# Patient Record
Sex: Male | Born: 1990 | Race: White | Hispanic: No | Smoking: Never smoker
Health system: Southern US, Community
[De-identification: ages and names within clinical notes are randomized; demographics above are authoritative.]

## PROBLEM LIST (undated history)

## (undated) HISTORY — PX: NOSE SURGERY: SHX723

## (undated) HISTORY — PX: APPENDECTOMY: SHX54

---
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
## 2010-03-04 ENCOUNTER — Observation Stay: Payer: Self-pay | Admitting: Surgery

## 2010-09-14 ENCOUNTER — Emergency Department: Payer: Self-pay | Admitting: Emergency Medicine

## 2010-11-21 ENCOUNTER — Emergency Department (HOSPITAL_COMMUNITY): Payer: BC Managed Care – PPO

## 2010-11-21 ENCOUNTER — Emergency Department (HOSPITAL_COMMUNITY)
Admission: EM | Admit: 2010-11-21 | Discharge: 2010-11-21 | Disposition: A | Discharge status: Home / Self Care | Payer: BC Managed Care – PPO | Attending: Emergency Medicine | Admitting: Emergency Medicine

## 2010-11-21 DIAGNOSIS — Y9229 Other specified public building as the place of occurrence of the external cause: Secondary | ICD-10-CM | POA: Insufficient documentation

## 2010-11-21 DIAGNOSIS — R4182 Altered mental status, unspecified: Secondary | ICD-10-CM | POA: Insufficient documentation

## 2010-11-21 DIAGNOSIS — R55 Syncope and collapse: Secondary | ICD-10-CM | POA: Insufficient documentation

## 2010-11-21 DIAGNOSIS — F1010 Alcohol abuse, uncomplicated: Secondary | ICD-10-CM | POA: Insufficient documentation

## 2010-11-21 DIAGNOSIS — IMO0002 Reserved for concepts with insufficient information to code with codable children: Secondary | ICD-10-CM | POA: Insufficient documentation

## 2010-11-21 DIAGNOSIS — W1789XA Other fall from one level to another, initial encounter: Secondary | ICD-10-CM | POA: Insufficient documentation

## 2010-11-21 DIAGNOSIS — F411 Generalized anxiety disorder: Secondary | ICD-10-CM | POA: Insufficient documentation

## 2010-11-21 LAB — POCT I-STAT, CHEM 8
Creatinine, Ser: 1.5 mg/dL (ref 0.4–1.5)
Glucose, Bld: 103 mg/dL — ABNORMAL HIGH (ref 70–99)
Hemoglobin: 15.6 g/dL (ref 13.0–17.0)
Potassium: 3.5 mEq/L (ref 3.5–5.1)

## 2010-11-21 LAB — CBC
HCT: 43.4 % (ref 39.0–52.0)
MCHC: 34.6 g/dL (ref 30.0–36.0)
MCV: 88.9 fL (ref 78.0–100.0)
RDW: 12.9 % (ref 11.5–15.5)

## 2010-11-21 LAB — DIFFERENTIAL
Basophils Absolute: 0 10*3/uL (ref 0.0–0.1)
Eosinophils Relative: 2 % (ref 0–5)
Lymphocytes Relative: 38 % (ref 12–46)
Monocytes Absolute: 0.4 10*3/uL (ref 0.1–1.0)

## 2010-11-21 LAB — RAPID URINE DRUG SCREEN, HOSP PERFORMED
Amphetamines: NOT DETECTED
Benzodiazepines: NOT DETECTED

## 2010-11-21 LAB — GLUCOSE, CAPILLARY: Glucose-Capillary: 97 mg/dL (ref 70–99)

## 2010-12-06 ENCOUNTER — Emergency Department: Payer: Self-pay | Admitting: Emergency Medicine

## 2012-01-20 ENCOUNTER — Emergency Department: Payer: Self-pay | Admitting: Emergency Medicine

## 2012-11-13 IMAGING — CR DG SHOULDER 3+V*R*
1 series · 3 of 3 positions shown · non-contrast
Comparison: none

REASON FOR EXAM: pain/decreased ROM s/p bull riding
COMMENTS:

PROCEDURE:     DXR - DXR SHOULDER RIGHT COMPLETE  - January 20, 2012 [DATE]
RESULT:     No acute bony or joint abnormality.

[Series 1: w shoulder external right · 0.14mm/px · 3 of 3 slices shown]
[im 1/3]
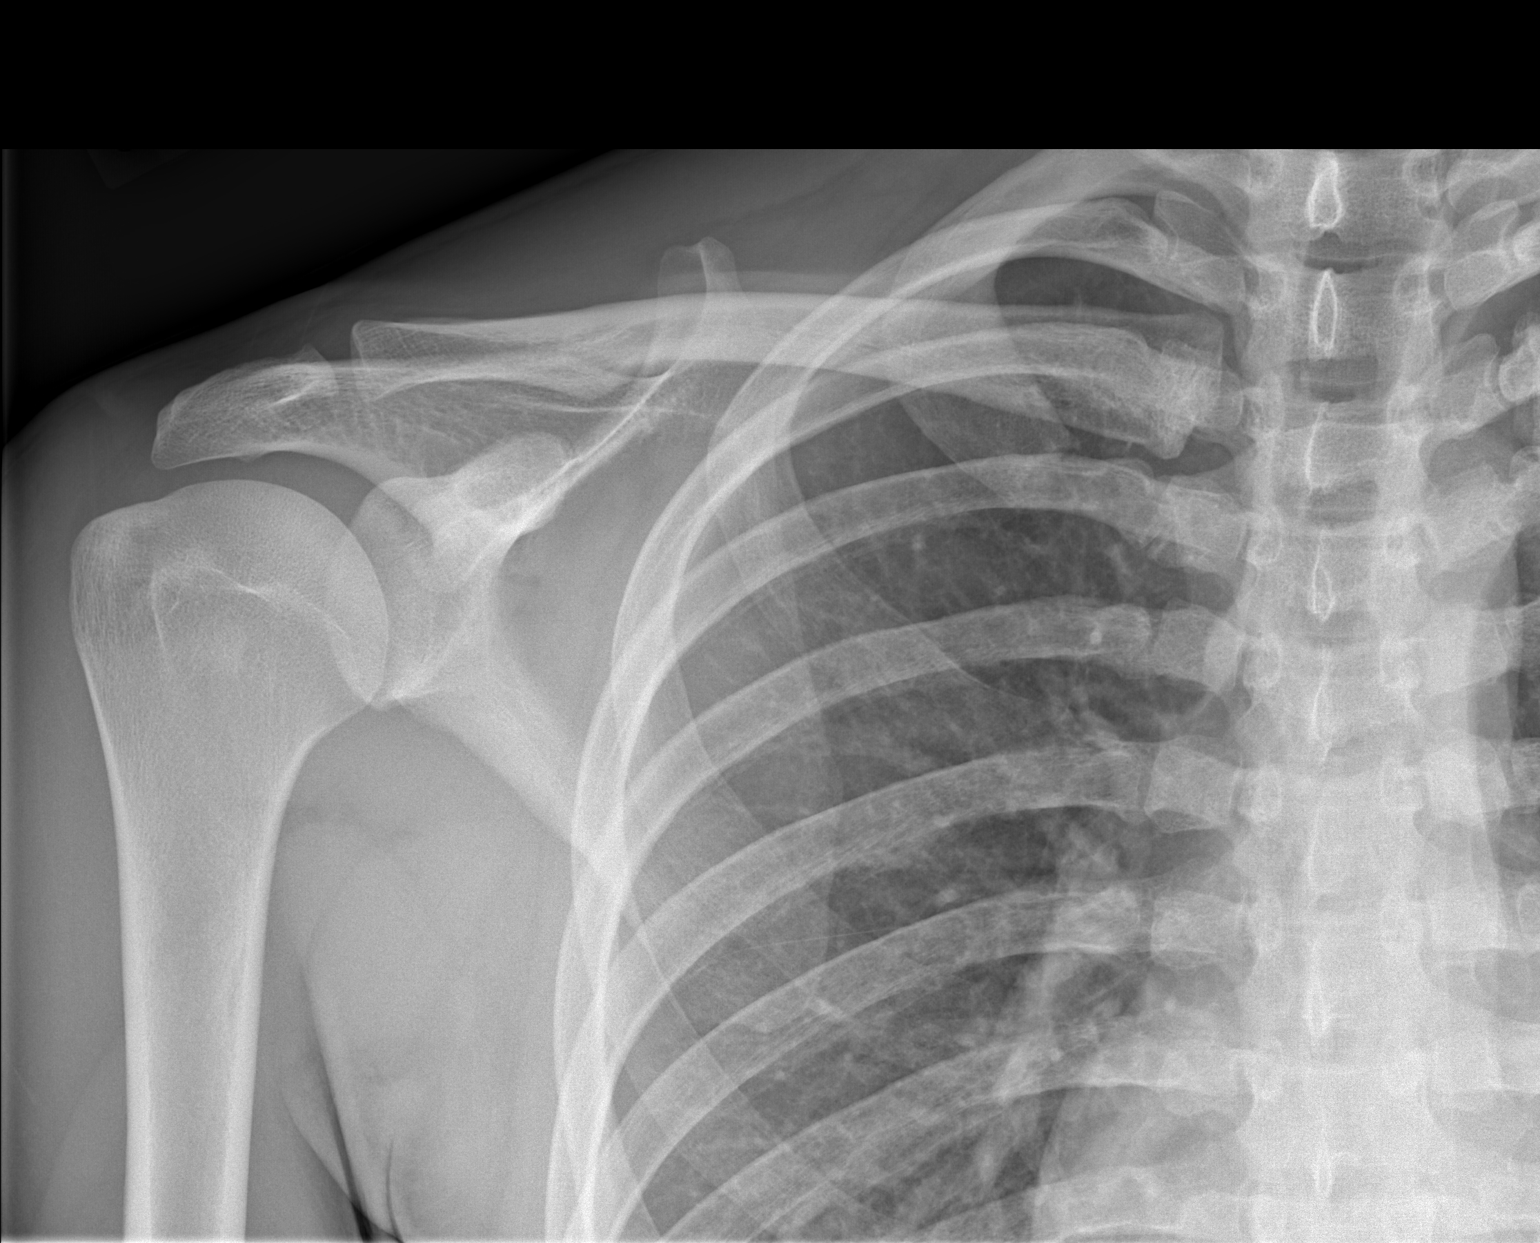
[im 2/3]
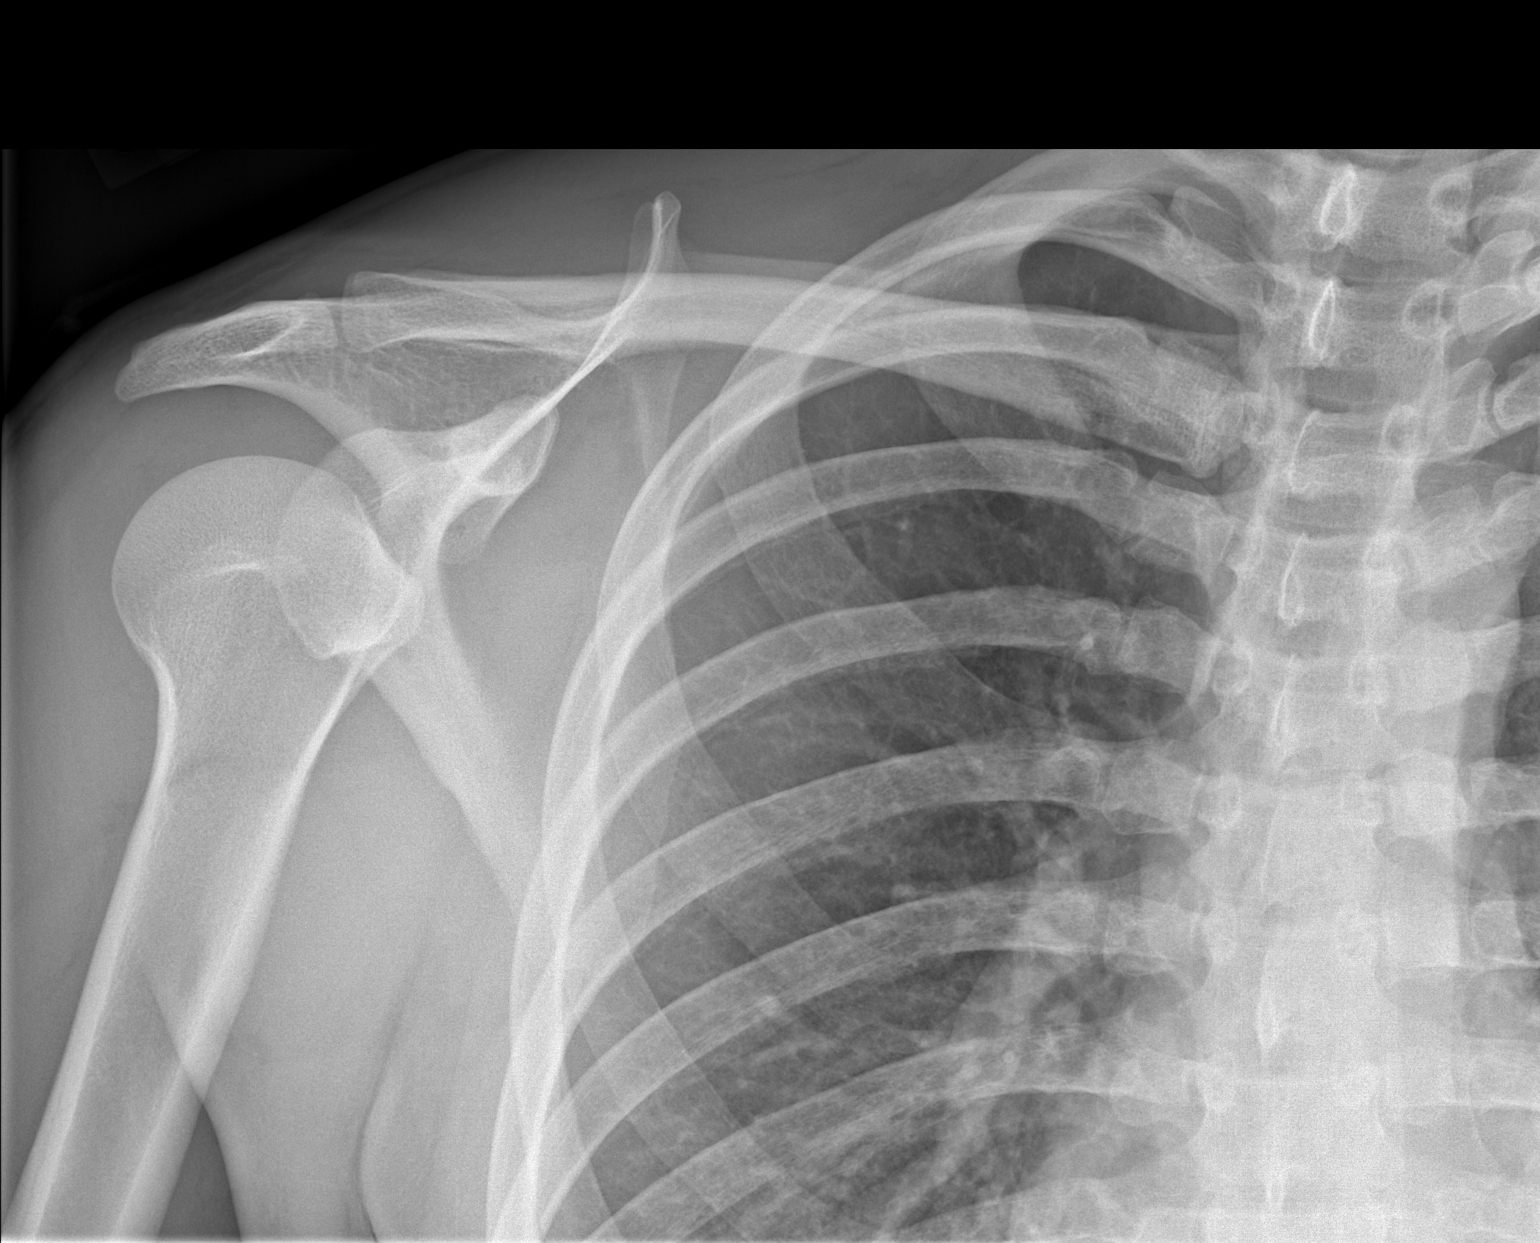
[im 3/3]
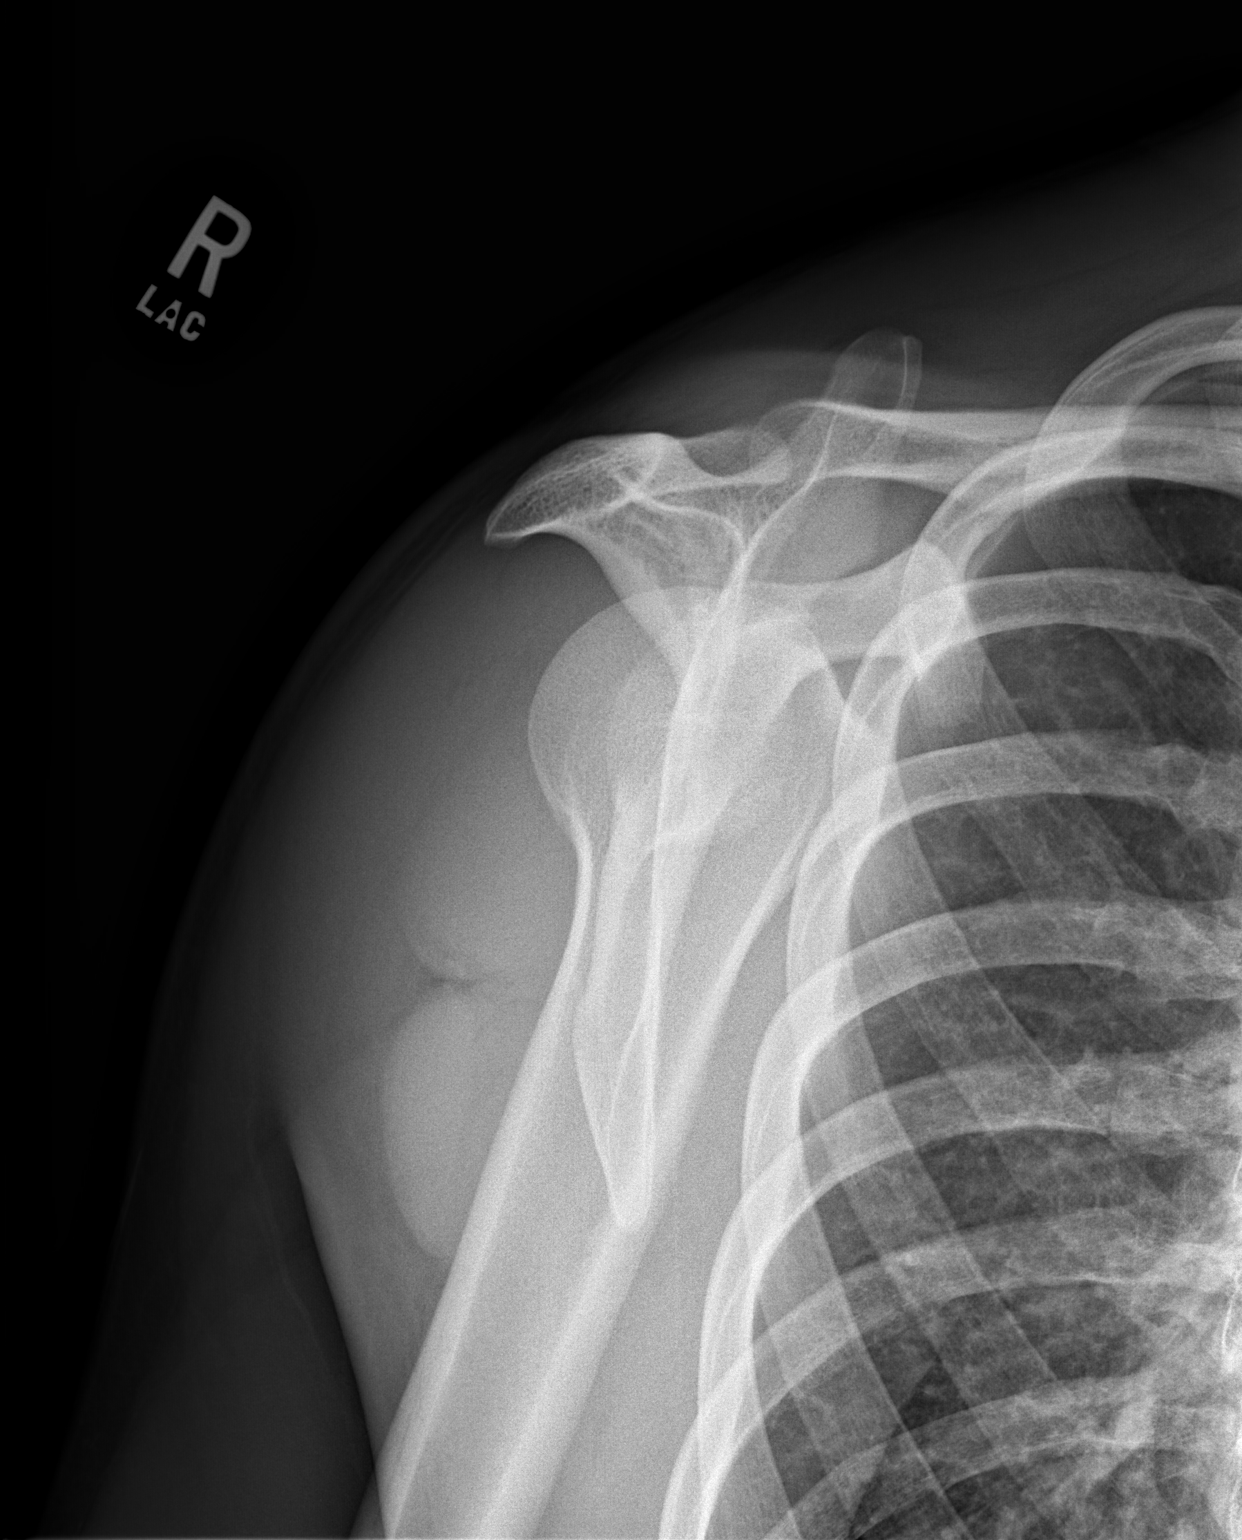

[3 of 3 positions shown; findings below may reference images not displayed]

IMPRESSION: No acute bony or joint abnormality.

## 2016-12-12 ENCOUNTER — Ambulatory Visit
Admission: EM | Admit: 2016-12-12 | Discharge: 2016-12-12 | Disposition: A | Discharge status: Home / Self Care | Payer: Self-pay | Attending: Family Medicine | Admitting: Family Medicine

## 2016-12-12 DIAGNOSIS — M25512 Pain in left shoulder: Secondary | ICD-10-CM

## 2016-12-12 MED ORDER — PREDNISONE 10 MG (21) PO TBPK: ORAL_TABLET | Freq: Every day | ORAL | 0 refills | Status: DC

## 2016-12-12 NOTE — ED Provider Notes (Signed)
[facility]    CSN: [id number] Arrival date & time: 12/12/16  1457     History   Chief Complaint Chief Complaint  Patient presents with  . Extremity Pain   HPI  26 year old male presents with complaints of shoulder pain.  Patient reports a one-week history of left shoulder pain. Pain is located laterally. He reports radiation down his arm. He reports some mild tingling of his thumb and thenar eminence. He states that his pain has been moderate in severity, 6/10. Worse with pressure/lying on it. He's been taking some Tylenol and Motrin with brief relief. He states that approximately one week ago he lifted heavy furniture. He does not recall any other potential injury. No other associated symptoms. No other complaint or concerns at this time.  History reviewed. No pertinent past medical history.  Past Surgical History:  Procedure Laterality Date  . APPENDECTOMY    . NOSE SURGERY      Home Medications    Prior to Admission medications   Medication Sig Start Date End Date Taking? Authorizing Provider  predniSONE (STERAPRED UNI-PAK 21 TAB) 10 MG (21) TBPK tablet Take by mouth daily. Take 6 tabs by mouth daily  for 2 days, then 5 tabs for 2 days, then 4 tabs for 2 days, then 3 tabs for 2 days, 2 tabs for 2 days, then 1 tab by mouth daily for 2 days 12/12/16   Tommie Sams, DO    Family History History reviewed. No pertinent family history.  Social History Social History  Substance Use Topics  . Smoking status: Never Smoker  . Smokeless tobacco: Never Used  . Alcohol use Yes     Comment: social     Allergies   Patient has no known allergies.   Review of Systems Review of Systems  Constitutional: Negative.   Musculoskeletal:       Left shoulder pain.  Neurological:       Paresthesia.  All other systems reviewed and are negative.  Physical Exam Triage Vital Signs ED Triage Vitals  Enc Vitals Group     BP 12/12/16 1512 (!) 122/99     Pulse Rate  12/12/16 1512 74     Resp 12/12/16 1512 18     Temp 12/12/16 1512 98.6 F (37 C)     Temp Source 12/12/16 1512 Oral     SpO2 12/12/16 1512 100 %     Weight 12/12/16 1512 187 lb (84.8 kg)     Height 12/12/16 1512 6' (1.829 m)     Head Circumference --      Peak Flow --      Pain Score 12/12/16 1513 6     Pain Loc --      Pain Edu? --      Excl. in GC? --    No data found.   Updated Vital Signs BP (!) 122/99 (BP Location: Left Arm)   Pulse 74   Temp 98.6 F (37 C) (Oral)   Resp 18   Ht 6' (1.829 m)   Wt 187 lb (84.8 kg)   SpO2 100%   BMI 25.36 kg/m   Physical Exam  Constitutional: He is oriented to person, place, and time. He appears well-developed. No distress.  HENT:  Head: Normocephalic and atraumatic.  Eyes: Conjunctivae are normal.  Neck: Normal range of motion.  Cardiovascular: Normal rate and regular rhythm.   Pulmonary/Chest: Effort normal and breath sounds normal.  Abdominal: He exhibits no distension.  Musculoskeletal:  Shoulder: Left Inspection reveals no abnormalities, atrophy or asymmetry. Palpation tenderness below the acromion posteriorly. ROM is full. Rotator cuff strength: Supraspinatus 4/5. Rest normal.  Positive empty can and positive Hawkins.   Neurological: He is alert and oriented to person, place, and time.  Skin: No rash noted.  Psychiatric: He has a normal mood and affect.  Vitals reviewed.  UC Treatments / Results  Labs (all labs ordered are listed, but only abnormal results are displayed) Labs Reviewed - No data to display  EKG  EKG Interpretation None       Radiology No results found.  Procedures Procedures (including critical care time)  Medications Ordered in UC Medications - No data to display   Initial Impression / Assessment and Plan / UC Course  I have reviewed the triage vital signs and the nursing notes.  Pertinent labs & imaging results that were available during my care of the patient were reviewed by me  and considered in my medical decision making (see chart for details).    26 year old male presents with left shoulder pain. Based on his physical exam and history I favor subacromial bursitis. He is possibly having some radicular pain given his reports of thumb tingling. However, he has no neck pain. Given his physical exam findings I think this is less likely. Treating for subacromial bursitis with prednisone.  Final Clinical Impressions(s) / UC Diagnoses   Final diagnoses:  Acute pain of left shoulder    New Prescriptions Discharge Medication List as of 12/12/2016  3:29 PM    START taking these medications   Details  predniSONE (STERAPRED UNI-PAK 21 TAB) 10 MG (21) TBPK tablet Take by mouth daily. Take 6 tabs by mouth daily  for 2 days, then 5 tabs for 2 days, then 4 tabs for 2 days, then 3 tabs for 2 days, 2 tabs for 2 days, then 1 tab by mouth daily for 2 days, Starting Sun 12/12/2016, Print         Tommie SamsJayce G Kaari Zeigler, DO 12/12/16 1546

## 2016-12-12 NOTE — Discharge Instructions (Signed)
Take the prednisone as prescribed. If your symptoms persist, I would recommend imaging.  Take care  Dr. Adriana Simasook

## 2016-12-12 NOTE — ED Triage Notes (Signed)
Pt reports he moved furniture last Friday (9 days ago) and has had shoulder pain ever since. Some numbness in left hand. Pain 6/10. No problems with ROM.

## 2017-08-20 ENCOUNTER — Other Ambulatory Visit: Payer: Self-pay

## 2017-08-20 ENCOUNTER — Ambulatory Visit
Admission: EM | Admit: 2017-08-20 | Discharge: 2017-08-20 | Disposition: A | Discharge status: Home / Self Care | Payer: Self-pay | Attending: Emergency Medicine | Admitting: Emergency Medicine

## 2017-08-20 DIAGNOSIS — B349 Viral infection, unspecified: Secondary | ICD-10-CM

## 2017-08-20 DIAGNOSIS — J029 Acute pharyngitis, unspecified: Secondary | ICD-10-CM

## 2017-08-20 LAB — RAPID STREP SCREEN (MED CTR MEBANE ONLY): Streptococcus, Group A Screen (Direct): NEGATIVE

## 2017-08-20 MED ORDER — AMOXICILLIN 875 MG PO TABS: 875.0000 mg | ORAL_TABLET | Freq: Two times a day (BID) | ORAL | 0 refills | Status: DC

## 2017-08-20 MED ORDER — LIDOCAINE VISCOUS 2 % MT SOLN: 15.0000 mL | Freq: Three times a day (TID) | OROMUCOSAL | 0 refills | Status: DC | PRN

## 2017-08-20 NOTE — ED Triage Notes (Signed)
Pt reports sick on Monday and Tuesday with vomiting and fever. Now with ulceration on right tonsillar area. No fever since beginning of week. Has congestion

## 2017-08-20 NOTE — Discharge Instructions (Signed)
Take medication as prescribed. Rest. Drink plenty of fluids.  ° °Follow up with your primary care physician this week as needed. Return to Urgent care for new or worsening concerns.  ° °

## 2017-08-20 NOTE — ED Provider Notes (Addendum)
[facility] ____________________________________________  Time seen: Approximately 11:01 AM  I have reviewed the triage vital signs and the nursing notes.   HISTORY  Chief Complaint Sore Throat  HPI Mark Cooper is a 26 y.o. male presenting for evaluation of sore throat and nasal congestion.  Patient reports Monday and Tuesday of this week he had some intermittent episodes of vomiting diarrhea.  States the vomiting and diarrhea has fully resolved and appetite has resumed.  States that over Wednesday and Thursday he felt much better but was having nasal congestion.  Reports for the last 2 days he has had very sore throat.  States continues to drink fluids well, not eating quite as much due to pain with swallowing.  Denies known sick contacts.  States that he believes he had a fever during the first 2 days of sickness, denies any other fevers.  States has been taking DayQuil and NyQuil all with mild improvement, no resolution.  Reports otherwise feels well.  Denies aggravating or relieving factors. Denies chest pain, shortness of breath, abdominal pain, or rash. Denies recent sickness. Denies recent antibiotic use.    History reviewed. No pertinent past medical history.  There are no active problems to display for this patient.   Past Surgical History:  Procedure Laterality Date  . APPENDECTOMY    . NOSE SURGERY       No current facility-administered medications for this encounter.   Current Outpatient Medications:  .  amoxicillin (AMOXIL) 875 MG tablet, Take 1 tablet (875 mg total) by mouth 2 (two) times daily., Disp: 20 tablet, Rfl: 0 .  lidocaine (XYLOCAINE) 2 % solution, Use as directed 15 mLs in the mouth or throat every 8 (eight) hours as needed (sore throat. gargle and spit as needed for sore throat.)., Disp: 100 mL, Rfl: 0  Allergies Patient has no known allergies.  History reviewed. No pertinent family history.  Social History Social History    Tobacco Use  . Smoking status: Never Smoker  . Smokeless tobacco: Never Used  Substance Use Topics  . Alcohol use: Yes    Comment: social  . Drug use: No    Review of Systems Constitutional: As above.  ENT: positive sore throat. Cardiovascular: Denies chest pain. Respiratory: Denies shortness of breath. Gastrointestinal: No abdominal pain.  AS above. States normal bowel movements now. Genitourinary: Negative for dysuria. Musculoskeletal: Negative for back pain. Skin: Negative for rash.   ____________________________________________   PHYSICAL EXAM:  VITAL SIGNS: ED Triage Vitals  Enc Vitals Group     BP 08/20/17 1014 (!) 145/87     Pulse Rate 08/20/17 1014 66     Resp 08/20/17 1014 18     Temp 08/20/17 1014 97.9 F (36.6 C)     Temp Source 08/20/17 1014 Oral     SpO2 08/20/17 1014 100 %     Weight 08/20/17 1015 185 lb (83.9 kg)     Height 08/20/17 1015 6' (1.829 m)     Head Circumference --      Peak Flow --      Pain Score 08/20/17 1016 4     Pain Loc --      Pain Edu? --      Excl. in GC? --    Constitutional: Alert and oriented. Well appearing and in no acute distress. Eyes: Conjunctivae are normal.  Head: Atraumatic. No sinus tenderness to palpation. No swelling. No erythema.  Ears: no erythema, normal TMs bilaterally.   Nose:Nasal congestion  Mouth/Throat: Mucous membranes are moist. Moderate pharyngeal erythema. 2+ bilateral tonsillar swelling. Right palate adjacent to uvula with approx 1cm area of whitish appearance, left posterior tonsil <1cm area of whitish appearance. No uvular shift or deviation.  No lesions or apthous appearing ulcers otherwise noted.  Neck: No stridor.  No cervical spine tenderness to palpation. Hematological/Lymphatic/Immunilogical: No cervical lymphadenopathy. Cardiovascular: Normal rate, regular rhythm. Grossly normal heart sounds.  Good peripheral circulation. Respiratory: Normal respiratory effort.  No retractions. No  wheezes, rales or rhonchi. Good air movement.  Gastrointestinal: Soft and nontender.  Musculoskeletal: Ambulatory with steady gait. No cervical, thoracic or lumbar tenderness to palpation. Neurologic:  Normal speech and language. No gait instability. Skin:  Skin appears warm, dry and intact. No rash noted. Psychiatric: Mood and affect are normal. Speech and behavior are normal.  ___________________________________________   LABS (all labs ordered are listed, but only abnormal results are displayed)  Labs Reviewed  RAPID STREP SCREEN (NOT AT [facility])  CULTURE, GROUP A STREP [facility])     PROCEDURES Procedures    INITIAL IMPRESSION / ASSESSMENT AND PLAN / ED COURSE  Pertinent labs & imaging results that were available during my care of the patient were reviewed by me and considered in my medical decision making (see chart for details).  Well appearing patient. No acute distress. Spouse at bedside. Suspect recent viral illness and viral upper respiratory infection. Strep negative, will culture.Exudate vs apthous ulcerative appearance. Pharynx appearance concerning for strep, also discussed mono. Discussed evaluation of mono with patient, declined and patient states will follow up if sore throat continues for mono testing. Will empirically treat for concern of strep with oral amoxicillin and prn lidocaine gargle. Encouraged rest, fluids, otc medication as needed. Discussed indication, risks and benefits of medications with patient.  Discussed follow up with Primary care physician this week. Discussed follow up and return parameters including no resolution or any worsening concerns. Patient verbalized understanding and agreed to plan.   ____________________________________________   FINAL CLINICAL IMPRESSION(S) / ED DIAGNOSES  Final diagnoses:  Pharyngitis, unspecified etiology  Viral illness     ED Discharge Orders        Ordered    amoxicillin (AMOXIL) 875 MG tablet  2 times daily      08/20/17 1036    lidocaine (XYLOCAINE) 2 % solution  Every 8 hours PRN     08/20/17 1036       Note: This dictation was prepared with Dragon dictation along with smaller phrase technology. Any transcriptional errors that result from this process are unintentional.          Renford DillsMiller, Nia Nathaniel, NP 08/20/17 1140

## 2017-08-23 LAB — CULTURE, GROUP A STREP (THRC)

## 2017-08-26 ENCOUNTER — Ambulatory Visit
Admission: EM | Admit: 2017-08-26 | Discharge: 2017-08-26 | Disposition: A | Discharge status: Home / Self Care | Payer: Self-pay | Attending: Family Medicine | Admitting: Family Medicine

## 2017-08-26 ENCOUNTER — Encounter: Payer: Self-pay | Admitting: *Deleted

## 2017-08-26 DIAGNOSIS — K121 Other forms of stomatitis: Secondary | ICD-10-CM

## 2017-08-26 DIAGNOSIS — J029 Acute pharyngitis, unspecified: Secondary | ICD-10-CM

## 2017-08-26 NOTE — ED Triage Notes (Signed)
Seen here recently for URI. Now c/o right ear pain and fullness.

## 2017-08-26 NOTE — Discharge Instructions (Signed)
Best of luck.  Take care  Dr. Berdena Cisek  

## 2017-08-26 NOTE — ED Provider Notes (Signed)
[facility]    CSN: [id number] Arrival date & time: 08/26/17  0940  History   Chief Complaint Chief Complaint  Patient presents with  . Otalgia   HPI  26 year old male presents with persistent severe sore throat.  Patient was seen on 11/24.  He reported sore throat and congestion.  Rapid strep was negative.  He was treated empirically for strep despite the negative test with amoxicillin.  Patient states that he continues to have severe sore throat.  He states that he has had some associated bilateral ear fullness.  He states that his sore throat is so severe that he is having difficulty eating.  He is able to tolerate warm liquids but is unable to eat food.  He has had no improvement with amoxicillin and was unable to use viscous lidocaine as he threw it up.  He states that he return for reevaluation as he is not improved and he was instructed to do so.  History reviewed. No pertinent past medical history.  There are no active problems to display for this patient.  Past Surgical History:  Procedure Laterality Date  . APPENDECTOMY    . NOSE SURGERY      Home Medications    Prior to Admission medications   Medication Sig Start Date End Date Taking? Authorizing Provider  amoxicillin (AMOXIL) 875 MG tablet Take 1 tablet (875 mg total) by mouth 2 (two) times daily. 08/20/17  Yes Renford DillsMiller, Lindsey, NP  lidocaine (XYLOCAINE) 2 % solution Use as directed 15 mLs in the mouth or throat every 8 (eight) hours as needed (sore throat. gargle and spit as needed for sore throat.). 08/20/17   Renford DillsMiller, Lindsey, NP    Family History History reviewed. No pertinent family history.  Social History Social History   Tobacco Use  . Smoking status: Never Smoker  . Smokeless tobacco: Never Used  Substance Use Topics  . Alcohol use: Yes    Comment: social  . Drug use: No     Allergies   Patient has no known allergies.   Review of Systems Review of Systems  Constitutional:  Negative for fatigue and fever.  HENT: Positive for sore throat.        Ear fullness.   Physical Exam Triage Vital Signs ED Triage Vitals  Enc Vitals Group     BP 08/26/17 1014 116/69     Pulse Rate 08/26/17 1014 (!) 56     Resp 08/26/17 1014 16     Temp 08/26/17 1014 98 F (36.7 C)     Temp Source 08/26/17 1014 Oral     SpO2 08/26/17 1014 100 %     Weight 08/26/17 1016 185 lb (83.9 kg)     Height 08/26/17 1016 6' (1.829 m)     Head Circumference --      Peak Flow --      Pain Score 08/26/17 1121 2     Pain Loc --      Pain Edu? --      Excl. in GC? --    No data found.  Updated Vital Signs BP 116/69 (BP Location: Left Arm)   Pulse (!) 56   Temp 98 F (36.7 C) (Oral)   Resp 16   Ht 6' (1.829 m)   Wt 185 lb (83.9 kg)   SpO2 100%   BMI 25.09 kg/m   Visual Acuity Right Eye Distance:   Left Eye Distance:   Bilateral Distance:    Right Eye Near:  Left Eye Near:    Bilateral Near:     Physical Exam  Constitutional: He is oriented to person, place, and time. He appears well-developed. No distress.  HENT:  Head: Normocephalic and atraumatic.  Oropharynx -right soft palate with a large ulceration and surrounding erythema. See photo.  Neck: Neck supple.  Cardiovascular: Normal rate and regular rhythm.  No murmur heard. Pulmonary/Chest: Effort normal and breath sounds normal. He has no wheezes. He has no rales.  Lymphadenopathy:    He has no cervical adenopathy.  Neurological: He is alert and oriented to person, place, and time.  Psychiatric: He has a normal mood and affect. His behavior is normal.       UC Treatments / Results  Labs (all labs ordered are listed, but only abnormal results are displayed) Labs Reviewed - No data to display  EKG  EKG Interpretation None       Radiology No results found.  Procedures Procedures (including critical care time)  Medications Ordered in UC Medications - No data to display   Initial Impression /  Assessment and Plan / UC Course  I have reviewed the triage vital signs and the nursing notes.  Pertinent labs & imaging results that were available during my care of the patient were reviewed by me and considered in my medical decision making (see chart for details).     26 year old male presents with severe sore throat.  Patient has a large ulceration noted.  Uncertain etiology/prognosis at this time. Given persistent symptoms, appearance I arrange for him to see ENT.  I feel that he warrants a biopsy.  Has an appointment at [facility] with [facility].  Final Clinical Impressions(s) / UC Diagnoses   Final diagnoses:  Oral ulceration  Acute pharyngitis, unspecified etiology    ED Discharge Orders    None     Controlled Substance Prescriptions [place] Controlled Substance Registry consulted? Not Applicable   Tommie SamsCook, Aamari West G, DO 08/26/17 1130

## 2017-09-02 ENCOUNTER — Other Ambulatory Visit: Payer: Self-pay

## 2017-09-02 ENCOUNTER — Ambulatory Visit
Admission: EM | Admit: 2017-09-02 | Discharge: 2017-09-02 | Disposition: A | Discharge status: Home / Self Care | Payer: Self-pay | Attending: Family Medicine | Admitting: Family Medicine

## 2017-09-02 DIAGNOSIS — S0501XA Injury of conjunctiva and corneal abrasion without foreign body, right eye, initial encounter: Secondary | ICD-10-CM

## 2017-09-02 MED ORDER — OFLOXACIN 0.3 % OP SOLN: 1.0000 [drp] | Freq: Four times a day (QID) | OPHTHALMIC | 0 refills | Status: AC

## 2017-09-02 NOTE — Discharge Instructions (Signed)
Recommended to FU with Ophthalmology x 24 hrs.

## 2017-09-02 NOTE — ED Triage Notes (Signed)
Right eye watery and feels scratchy x past two days. "It feels like I have something in it."

## 2017-09-02 NOTE — ED Provider Notes (Addendum)
[facility]    CSN: [id number] Arrival date & time: 09/02/17  0809     History   Chief Complaint Chief Complaint  Patient presents with  . Eye Drainage    HPI Mark Cooper is a 26 y.o. male presented to clinic with CC or pain, redness and foreign body sensation to Right eye.C/O some blurry vision. Pt reports doing remodeling at house and something fell in the eye. Sx worsens when he scratched the eye.   The history is provided by the patient.    History reviewed. No pertinent past medical history.  There are no active problems to display for this patient.   Past Surgical History:  Procedure Laterality Date  . APPENDECTOMY    . NOSE SURGERY         Home Medications    Prior to Admission medications   Medication Sig Start Date End Date Taking? Authorizing Provider  amoxicillin (AMOXIL) 875 MG tablet Take 1 tablet (875 mg total) by mouth 2 (two) times daily. 08/20/17   Renford DillsMiller, Lindsey, NP  lidocaine (XYLOCAINE) 2 % solution Use as directed 15 mLs in the mouth or throat every 8 (eight) hours as needed (sore throat. gargle and spit as needed for sore throat.). 08/20/17   Renford DillsMiller, Lindsey, NP    Family History History reviewed. No pertinent family history.  Social History Social History   Tobacco Use  . Smoking status: Never Smoker  . Smokeless tobacco: Never Used  Substance Use Topics  . Alcohol use: Yes    Comment: social  . Drug use: No     Allergies   Patient has no known allergies.   Review of Systems Review of Systems  HENT: Negative.   Eyes: Positive for photophobia, pain, discharge (clear watery discharge from rt eye), redness and visual disturbance.  Respiratory: Negative.   Cardiovascular: Negative.   Psychiatric/Behavioral: Negative.      Physical Exam Triage Vital Signs ED Triage Vitals [09/02/17 0823]  Enc Vitals Group     BP 125/78     Pulse Rate 71     Resp 16     Temp 97.8 F (36.6 C)     Temp Source Oral   SpO2 100 %     Weight 185 lb (83.9 kg)     Height 6' (1.829 m)     Head Circumference      Peak Flow      Pain Score 3     Pain Loc      Pain Edu?      Excl. in GC?    No data found.  Updated Vital Signs BP 125/78 (BP Location: Left Arm)   Pulse 71   Temp 97.8 F (36.6 C) (Oral)   Resp 16   Ht 6' (1.829 m)   Wt 185 lb (83.9 kg)   SpO2 100%   BMI 25.09 kg/m   Visual Acuity Right Eye Distance:   Left Eye Distance:   Bilateral Distance:    Right Eye Near:   Left Eye Near:    Bilateral Near:     Physical Exam  Constitutional: He appears well-nourished. No distress.  HENT:  Head: Normocephalic.  Eyes: Pupils are equal, round, and reactive to light. Right eye exhibits discharge (clear watery discharge from rt eye. Conjunctiva erythematous. ).  Pain, swelling, redness, clear watery discharge from rt eye. Reports sandy and scratchy sensation with blurry vision on and off. Denies contact lens use.  Neck: Normal range of motion.  Cardiovascular: Normal rate and regular rhythm.  Pulmonary/Chest: Effort normal and breath sounds normal.  Skin: Skin is warm.     UC Treatments / Results  Labs (all labs ordered are listed, but only abnormal results are displayed) Labs Reviewed - No data to display  EKG  EKG Interpretation None       Radiology No results found.  Procedures Procedures (including critical care time)  Medications Ordered in UC Medications - No data to display   Initial Impression / Assessment and Plan / UC Course  I have reviewed the triage vital signs and the nursing notes.  Pertinent labs & imaging results that were available during my care of the patient were reviewed by me and considered in my medical decision making (see chart for details).   Woods lamp exam performed. Tetracaine used as topical anesthetic. Confirmed small corneal abrasion in the center of cornea. Eye irrigated with 30-40 ml of eye wash solution. Pt tolerated procedure well.  Ophthalmology FU recommended x 24 hrs.     Final Clinical Impressions(s) / UC Diagnoses   Final diagnoses:  None    ED Discharge Orders    None       Controlled Substance Prescriptions [place] Controlled Substance Registry consulted? Not Applicable   Reinaldo RaddleMultani, Trysten Bernard, NP 09/02/17 0847    Reinaldo RaddleMultani, Shashwat Cleary, NP 09/02/17 [phone]

## 2022-11-16 ENCOUNTER — Ambulatory Visit: Payer: Self-pay | Admitting: Physician Assistant

## 2022-11-16 ENCOUNTER — Encounter: Payer: Self-pay | Admitting: Physician Assistant

## 2022-11-16 ENCOUNTER — Ambulatory Visit: Payer: Self-pay

## 2022-11-16 VITALS — BP 115/76 | HR 56 | Temp 97.8°F | Resp 14 | Ht 71.0 in | Wt 197.0 lb

## 2022-11-16 DIAGNOSIS — Z021 Encounter for pre-employment examination: Secondary | ICD-10-CM

## 2022-11-16 LAB — POCT URINALYSIS DIPSTICK
Bilirubin, UA: NEGATIVE
Blood, UA: NEGATIVE
Glucose, UA: NEGATIVE
Ketones, UA: NEGATIVE
Leukocytes, UA: NEGATIVE
Nitrite, UA: NEGATIVE
Protein, UA: NEGATIVE
Spec Grav, UA: 1.015 (ref 1.010–1.025)
Urobilinogen, UA: 0.2 E.U./dL
pH, UA: 6.5 (ref 5.0–8.0)

## 2022-11-16 NOTE — Progress Notes (Signed)
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  UDS is cleared.

## 2022-11-16 NOTE — Progress Notes (Signed)
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 Pt denies any issues or concerns at this time./CL,RMA

## 2022-11-16 NOTE — Progress Notes (Signed)
   [facility] ____________________________________________   None    (approximate)  I have reviewed the triage vital signs and the nursing notes.   HISTORY  Chief Complaint Employment Physical   HPI Mark Cooper is a 32 y.o. 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  Patient reports no concerns or complaints.        History reviewed. No pertinent past medical history.  There are no problems to display for this patient.   Past Surgical History:  Procedure Laterality Date   APPENDECTOMY     NOSE SURGERY      Prior to Admission medications   Not on File    Allergies Patient has no known allergies.  History reviewed. No pertinent family history.  Social History Social History   Tobacco Use   Smoking status: Never   Smokeless tobacco: Current  Vaping Use   Vaping Use: Never used  Substance Use Topics   Alcohol use: Yes    Comment: social   Drug use: No    Review of Systems Constitutional: No fever/chills Eyes: No visual changes. ENT: No sore throat. Cardiovascular: Denies chest pain. Respiratory: Denies shortness of breath. Gastrointestinal: No abdominal pain.  No nausea, no vomiting.  No diarrhea.  No constipation. Genitourinary: Negative for dysuria. Musculoskeletal: Negative for back pain. Skin: Negative for rash. Neurological: Negative for headaches, focal weakness or numbness. ____________________________________________   PHYSICAL EXAM:  VITAL SIGNS: BP is 115/76, pulse 56, respiration 14, temp 97.8, and patient 99% O2 sat on room air.  Patient was hide 97 pounds and BMI is 27.48. Constitutional: Alert and oriented. Well appearing and in no acute distress. Eyes: Conjunctivae are normal. PERRL. EOMI. Head: Atraumatic. Nose: No congestion/rhinnorhea. Mouth/Throat: Mucous membranes are moist.  Oropharynx non-erythematous. Neck: No stridor. No cervical spine tenderness to  palpation. Hematological/Lymphatic/Immunilogical: No cervical lymphadenopathy. Cardiovascular: Normal rate, regular rhythm. Grossly normal heart sounds.  Good peripheral circulation. Respiratory: Normal respiratory effort.  No retractions. Lungs CTAB. Gastrointestinal: Soft and nontender. No distention. No abdominal bruits. No CVA tenderness. Genitourinary: Deferred Musculoskeletal: No lower extremity tenderness nor edema.  No joint effusions. Neurologic:  Normal speech and language. No gross focal neurologic deficits are appreciated. No gait instability. Skin:  Skin is warm, dry and intact. No rash noted. Psychiatric: Mood and affect are normal. Speech and behavior are normal.  ____________________________________________   LABS Pending ____________________________________________  EKG  Sinus bradycardia at 53 bpm ____________________________________________    ____________________________________________   INITIAL IMPRESSION / ASSESSMENT AND PLAN / ED COURSE  As part of my medical decision making, I reviewed the following data within the electronic MEDICAL RECORD NUMBER       No acute findings on physical exam.  Labs are pending.     ____________________________________________   FINAL CLINICAL IMPRESSION  Well exam   ED Discharge Orders     None        Note:  This document was prepared using Dragon voice recognition software and may include unintentional dictation errors.

## 2022-11-18 LAB — CMP12+LP+TP+TSH+6AC+CBC/D/PLT
ALT: 15 IU/L (ref 0–44)
AST: 17 IU/L (ref 0–40)
Albumin/Globulin Ratio: 2.1 (ref 1.2–2.2)
Albumin: 4.8 g/dL (ref 4.1–5.1)
Alkaline Phosphatase: 70 IU/L (ref 44–121)
BUN/Creatinine Ratio: 15 (ref 9–20)
BUN: 16 mg/dL (ref 6–20)
Basophils Absolute: 0 10*3/uL (ref 0.0–0.2)
Basos: 0 %
Bilirubin Total: 0.5 mg/dL (ref 0.0–1.2)
Calcium: 9.2 mg/dL (ref 8.7–10.2)
Chloride: 102 mmol/L (ref 96–106)
Chol/HDL Ratio: 4 ratio (ref 0.0–5.0)
Cholesterol, Total: 174 mg/dL (ref 100–199)
Creatinine, Ser: 1.08 mg/dL (ref 0.76–1.27)
EOS (ABSOLUTE): 0.1 10*3/uL (ref 0.0–0.4)
Eos: 2 %
Estimated CHD Risk: 0.7 times avg. (ref 0.0–1.0)
Free Thyroxine Index: 1.5 (ref 1.2–4.9)
GGT: 18 IU/L (ref 0–65)
Globulin, Total: 2.3 g/dL (ref 1.5–4.5)
Glucose: 89 mg/dL (ref 70–99)
HDL: 44 mg/dL (ref >39)
Hematocrit: 42.3 % (ref 37.5–51.0)
Hemoglobin: 14.1 g/dL (ref 13.0–17.7)
Immature Grans (Abs): 0 10*3/uL (ref 0.0–0.1)
Immature Granulocytes: 0 %
Iron: 98 ug/dL (ref 38–169)
LDH: 145 IU/L (ref 121–224)
LDL Chol Calc (NIH): 119 mg/dL — ABNORMAL HIGH (ref 0–99)
Lymphocytes Absolute: 2 10*3/uL (ref 0.7–3.1)
Lymphs: 48 %
MCH: 30 pg (ref 26.6–33.0)
MCHC: 33.3 g/dL (ref 31.5–35.7)
MCV: 90 fL (ref 79–97)
Monocytes Absolute: 0.4 10*3/uL (ref 0.1–0.9)
Monocytes: 9 %
Neutrophils Absolute: 1.7 10*3/uL (ref 1.4–7.0)
Neutrophils: 41 %
Phosphorus: 3.2 mg/dL (ref 2.8–4.1)
Platelets: 236 10*3/uL (ref 150–450)
Potassium: 3.7 mmol/L (ref 3.5–5.2)
RBC: 4.7 x10E6/uL (ref 4.14–5.80)
RDW: 12 % (ref 11.6–15.4)
Sodium: 139 mmol/L (ref 134–144)
T3 Uptake Ratio: 28 % (ref 24–39)
T4, Total: 5.4 ug/dL (ref 4.5–12.0)
TSH: 2.8 u[IU]/mL (ref 0.450–4.500)
Total Protein: 7.1 g/dL (ref 6.0–8.5)
Triglycerides: 59 mg/dL (ref 0–149)
Uric Acid: 5.5 mg/dL (ref 3.8–8.4)
VLDL Cholesterol Cal: 11 mg/dL (ref 5–40)
WBC: 4.2 10*3/uL (ref 3.4–10.8)
eGFR: 94 mL/min/{1.73_m2} (ref >59)

## 2022-11-18 LAB — HEPATITIS B SURFACE ANTIBODY,QUALITATIVE: Hep B Surface Ab, Qual: REACTIVE

## 2022-11-18 LAB — QUANTIFERON-TB GOLD PLUS
QuantiFERON Mitogen Value: >10 IU/mL
QuantiFERON Nil Value: 0.08 IU/mL
QuantiFERON TB1 Ag Value: 0.09 IU/mL
QuantiFERON TB2 Ag Value: 0.07 IU/mL
QuantiFERON-TB Gold Plus: NEGATIVE

## 2023-03-27 ENCOUNTER — Emergency Department
Admission: EM | Admit: 2023-03-27 | Discharge: 2023-03-27 | Disposition: A | Discharge status: Home / Self Care | Payer: Self-pay | Attending: Emergency Medicine | Admitting: Emergency Medicine

## 2023-03-27 DIAGNOSIS — F10129 Alcohol abuse with intoxication, unspecified: Secondary | ICD-10-CM | POA: Insufficient documentation

## 2023-03-27 DIAGNOSIS — F10929 Alcohol use, unspecified with intoxication, unspecified: Secondary | ICD-10-CM

## 2023-03-27 DIAGNOSIS — Y906 Blood alcohol level of 120-199 mg/100 ml: Secondary | ICD-10-CM | POA: Insufficient documentation

## 2023-03-27 LAB — CBC
HCT: 42.8 % (ref 39.0–52.0)
Hemoglobin: 14.4 g/dL (ref 13.0–17.0)
MCH: 31 pg (ref 26.0–34.0)
MCV: 92 fL (ref 80.0–100.0)
Platelets: 245 10*3/uL (ref 150–400)
RDW: 12.7 % (ref 11.5–15.5)

## 2023-03-27 LAB — BASIC METABOLIC PANEL
Anion gap: 12 (ref 5–15)
BUN: 16 mg/dL (ref 6–20)
CO2: 22 mmol/L (ref 22–32)
Chloride: 108 mmol/L (ref 98–111)
Creatinine, Ser: 1.23 mg/dL (ref 0.61–1.24)
GFR, Estimated: >60 mL/min (ref >60)
Glucose, Bld: 101 mg/dL — ABNORMAL HIGH (ref 70–99)
Potassium: 3.9 mmol/L (ref 3.5–5.1)
Sodium: 142 mmol/L (ref 135–145)

## 2024-03-25 ENCOUNTER — Ambulatory Visit (INDEPENDENT_AMBULATORY_CARE_PROVIDER_SITE_OTHER)

## 2024-03-25 ENCOUNTER — Ambulatory Visit: Payer: Self-pay | Admitting: Family Medicine

## 2024-03-25 ENCOUNTER — Ambulatory Visit
Admission: EM | Admit: 2024-03-25 | Discharge: 2024-03-25 | Disposition: A | Discharge status: Home / Self Care | Attending: Family Medicine | Admitting: Family Medicine

## 2024-03-25 DIAGNOSIS — M79642 Pain in left hand: Secondary | ICD-10-CM

## 2024-03-25 DIAGNOSIS — M79632 Pain in left forearm: Secondary | ICD-10-CM

## 2024-03-25 MED ORDER — IBUPROFEN 600 MG PO TABS
600.0000 mg | ORAL_TABLET | Freq: Four times a day (QID) | ORAL | 0 refills | Status: AC | PRN
Start: 2024-03-25 — End: ?

## 2024-03-25 NOTE — ED Triage Notes (Signed)
 Left hand injury last night. Patient states that he punched a punching bag and thinks he broke his hand

## 2024-03-25 NOTE — ED Provider Notes (Signed)
 [facility]    CSN: [id number] Arrival date & time: 03/25/24  1502      History   Chief Complaint Chief Complaint  Patient presents with   Hand Injury    HPI  HPI Mark Cooper is a 33 y.o. male.   [name] presents for left hand pain that started last night after punching a punching bag. He was messing around with a couple of his buddies.  Has troubling making a fist and moving his wrist without pain.    He is left handed.      History reviewed. No pertinent past medical history.  There are no active problems to display for this patient.   Past Surgical History:  Procedure Laterality Date   APPENDECTOMY     NOSE SURGERY         Home Medications    Prior to Admission medications   Medication Sig Start Date End Date Taking? Authorizing Provider  ibuprofen (ADVIL) 600 MG tablet Take 1 tablet (600 mg total) by mouth every 6 (six) hours as needed. 03/25/24  Yes Corri Delapaz, DO    Family History History reviewed. No pertinent family history.  Social History Social History   Tobacco Use   Smoking status: Never   Smokeless tobacco: Current  Vaping Use   Vaping status: Never Used  Substance Use Topics   Alcohol use: Yes    Comment: social   Drug use: No     Allergies   Patient has no known allergies.   Review of Systems Review of Systems: :negative unless otherwise stated in HPI.      Physical Exam Triage Vital Signs ED Triage Vitals [03/25/24 1512]  Encounter Vitals Group     BP 137/80     Girls Systolic BP Percentile      Girls Diastolic BP Percentile      Boys Systolic BP Percentile      Boys Diastolic BP Percentile      Pulse Rate 64     Resp 17     Temp 98.7 F (37.1 C)     Temp Source Oral     SpO2 96 %     Weight      Height      Head Circumference      Peak Flow      Pain Score 9     Pain Loc      Pain Education      Exclude from Growth Chart    No data found.  Updated Vital Signs BP 137/80 (BP  Location: Right Arm)   Pulse 64   Temp 98.7 F (37.1 C) (Oral)   Resp 17   SpO2 96%   Visual Acuity Right Eye Distance:   Left Eye Distance:   Bilateral Distance:    Right Eye Near:   Left Eye Near:    Bilateral Near:     Physical Exam GEN: well appearing male in no acute distress  CVS: well perfused, strong radial pulse   RESP: speaking in full sentences without pause, no respiratory distress  MSK:  Right Hand/Forearm: Inspection: No obvious body deformity however has swelling with erythema but no bruising, abrasions on knuckles of right hand  Palpation: TTP distal forearm, wrist and diffusely across hand  ROM: limited,  Fully able to extend but not flex his finger. Decreased movement of wrist 2/2 pain  Strength: 5/5 strength in the forearm, wrist and interosseus muscles b/l Neurovascular: NV intact b/l  UC Treatments / Results  Labs (all labs ordered are listed, but only abnormal results are displayed) Labs Reviewed - No data to display  EKG   Radiology DG Hand Complete Left Result Date: 03/25/2024 CLINICAL DATA:  Pain after injury. EXAM: LEFT HAND - COMPLETE 3+ VIEW; LEFT FOREARM - 2 VIEW COMPARISON:  None Available. FINDINGS: Hand: The third through fifth digits are held in flexion on all views. This limits detailed assessment. Allowing for this, no evidence of acute fracture. There is no frank dislocation. Carpal and metacarpals are intact. Soft tissue edema overlies the dorsum of the hand. Forearm: No acute fracture. The cortical margins of the radius and ulna are intact. Wrist and elbow alignment are maintained. No focal soft tissue abnormalities. IMPRESSION: 1. Soft tissue edema overlies the dorsum of the hand. No acute fracture of the hand or forearm. 2. The third through fifth digits are held in flexion on all views. This limits detailed assessment. Electronically Signed   By: Andrea Gasman M.D.   On: 03/25/2024 16:01   DG Forearm Left Result Date:  03/25/2024 CLINICAL DATA:  Pain after injury. EXAM: LEFT HAND - COMPLETE 3+ VIEW; LEFT FOREARM - 2 VIEW COMPARISON:  None Available. FINDINGS: Hand: The third through fifth digits are held in flexion on all views. This limits detailed assessment. Allowing for this, no evidence of acute fracture. There is no frank dislocation. Carpal and metacarpals are intact. Soft tissue edema overlies the dorsum of the hand. Forearm: No acute fracture. The cortical margins of the radius and ulna are intact. Wrist and elbow alignment are maintained. No focal soft tissue abnormalities. IMPRESSION: 1. Soft tissue edema overlies the dorsum of the hand. No acute fracture of the hand or forearm. 2. The third through fifth digits are held in flexion on all views. This limits detailed assessment. Electronically Signed   By: Andrea Gasman M.D.   On: 03/25/2024 16:01     Procedures Procedures (including critical care time)  Medications Ordered in UC Medications - No data to display  Initial Impression / Assessment and Plan / UC Course  I have reviewed the triage vital signs and the nursing notes.  Pertinent labs & imaging results that were available during my care of the patient were reviewed by me and considered in my medical decision making (see chart for details).      Pt is a 33 y.o.  male with acute left hand pain after punching a punching bag last night. Declined pain control.  On exam, pt has tenderness diffusely across his hand, distal forearm and decreased ROM of left wrist concerning for fracture.   Obtained right hand plain films.  Personally interpreted by me were remarkable for 5th metacarpal fracture that doesn't appear to be dislocated. Patient aware the radiologist has not read his xray and is comfortable with the preliminary read by me. Will review radiologist read when available and call patient if a change in plan is warranted.  Pt agreeable to this plan prior to discharge.   Pt placed in ulnar  gutter splint with a shoulder sling. Patient to gradually return to normal activities, as tolerated and continue ordinary activities within the limits permitted by pain. Prescribed ibuprofen 600 mg for pain relief.  Tylenol PRN. Pt declined stronger pain medication.   Advised patient to avoid OTC NSAIDs while taking prescription NSAID. Counseled patient on red flag symptoms and when to seek immediate care.    Patient to follow up with orthopedic provider, if symptoms  do not improve with conservative treatment.  Return and ED precautions given. Understanding voiced. Discussed MDM, treatment plan and plan for follow-up with patient who agrees with plan.   Radiologist impression reviewed.  Pt to follow up with orthopedic for further evaluation of suspected Boxer's fracture.   Final Clinical Impressions(s) / UC Diagnoses   Final diagnoses:  Left hand pain  Left forearm pain     Discharge Instructions      If medication was prescribed, stop by the pharmacy to pick up your prescriptions.  For your  pain, Take 1000 mg Tylenol with ibuprofen as needed for pain.  As pain recedes, begin normal activities slowly as tolerated.  Follow up with an orthopedic provider for further management of your hand fracture. Call to schedule an appointment.   Watch for worsening symptoms such as an increasing weakness or loss of sensation, increasing pain or function. Should any of these occur, go to the emergency department immediately.        ED Prescriptions     Medication Sig Dispense Auth. Provider   ibuprofen (ADVIL) 600 MG tablet Take 1 tablet (600 mg total) by mouth every 6 (six) hours as needed. 30 tablet Findlay Dagher, DO      I have reviewed the PDMP during this encounter.   Sybol Morre, DO 03/25/24 1606

## 2024-03-25 NOTE — Discharge Instructions (Addendum)
 If medication was prescribed, stop by the pharmacy to pick up your prescriptions.  For your  pain, Take 1000 mg Tylenol with ibuprofen as needed for pain.  As pain recedes, begin normal activities slowly as tolerated.  Follow up with an orthopedic provider for further management of your hand fracture. Call to schedule an appointment.   Watch for worsening symptoms such as an increasing weakness or loss of sensation, increasing pain or function. Should any of these occur, go to the emergency department immediately.

## 2024-03-27 ENCOUNTER — Ambulatory Visit: Payer: Self-pay | Admitting: Family
